# Patient Record
Sex: Female | Born: 1975 | Race: Black or African American | Hispanic: No | Marital: Single | Smoking: Never smoker
Health system: Southern US, Community
[De-identification: ages and names within clinical notes are randomized; demographics above are authoritative.]

## PROBLEM LIST (undated history)

## (undated) HISTORY — PX: DENTAL SURGERY: SHX609

---

## 2011-02-02 ENCOUNTER — Other Ambulatory Visit (HOSPITAL_COMMUNITY)
Admission: RE | Admit: 2011-02-02 | Discharge: 2011-02-02 | Disposition: A | Discharge status: Home / Self Care | Payer: 59 | Source: Ambulatory Visit | Attending: Obstetrics and Gynecology | Admitting: Obstetrics and Gynecology

## 2011-02-02 ENCOUNTER — Encounter: Payer: Self-pay | Admitting: Women's Health

## 2011-02-02 ENCOUNTER — Ambulatory Visit (INDEPENDENT_AMBULATORY_CARE_PROVIDER_SITE_OTHER): Payer: 59 | Admitting: Women's Health

## 2011-02-02 VITALS — BP 110/70 | Ht 66.25 in | Wt 121.0 lb

## 2011-02-02 DIAGNOSIS — Z01419 Encounter for gynecological examination (general) (routine) without abnormal findings: Secondary | ICD-10-CM

## 2011-02-02 MED ORDER — NORGESTIMATE-ETH ESTRADIOL 0.25-35 MG-MCG PO TABS: 1.0000 | ORAL_TABLET | Freq: Every day | ORAL | Status: DC

## 2011-02-02 NOTE — Progress Notes (Signed)
Cassandra Blake [birth year] [id number]    History:    The patient presents for annual exam.  Monthly 5 to7 day cycle on Ortho-Cyclen. Negative STD screen in December 2012. History of normal Paps per patient.   Past medical history, past surgical history, family history and social history were all reviewed and documented in the EPIC chart. Mother and maternal aunt with breast cancer, BRCA history unknown. Mother is doing well. Moved here from [place] works at [employer].   ROS:  A  ROS was performed and pertinent positives and negatives are included in the history.  Exam:  Filed Vitals:   02/02/11 1356  BP: 110/70    General appearance:  Normal Head/Neck:  Normal, without cervical or supraclavicular adenopathy. Thyroid:  Symmetrical, normal in size, without palpable masses or nodularity. Respiratory  Effort:  Normal  Auscultation:  Clear without wheezing or rhonchi Cardiovascular  Auscultation:  Regular rate, without rubs, murmurs or gallops  Edema/varicosities:  Not grossly evident Abdominal  Soft,nontender, without masses, guarding or rebound.  Liver/spleen:  No organomegaly noted  Hernia:  None appreciated  Skin  Inspection:  Grossly normal  Palpation:  Grossly normal Neurologic/psychiatric  Orientation:  Normal with appropriate conversation.  Mood/affect:  Normal  Genitourinary    Breasts: Examined lying and sitting.     Right: Without masses, retractions, discharge or axillary adenopathy.     Left: Without masses, retractions, discharge or axillary adenopathy.   Inguinal/mons:  Normal without inguinal adenopathy  External genitalia:  Normal  BUS/Urethra/Skene's glands:  Normal  Bladder:  Normal  Vagina:  Normal  Cervix:  Normal/menses  Uterus:   normal in size, shape and contour.  Midline and mobile  Adnexa/parametria:     Rt: Without masses or tenderness.   Lt: Without masses or tenderness.  Anus and perineum: Normal  Digital rectal exam: Normal sphincter tone  without palpated masses or tenderness  Assessment/Plan:  36 y.o. SBF G0 for annual exam with no complaints.  Normal GYN exam on Ortho-Cyclen  Plan: Ortho-Cyclen prescription, proper use, slight risk for blood clots and strokes reviewed. Condoms encouraged until permanent partner. Mother and maternal aunt with breast cancer will question if BRCA testing has been done. Reviewed importance of SBEs and report changes. Exercise, calcium rich diet, MVI daily encouraged. Pap only, declines need for any blood work today.   Harrington Challenger [facility], 2:43 PM 02/02/2011

## 2011-02-02 NOTE — Patient Instructions (Signed)
BRCA  Testing for breast cancer gene   Did your Mom have?

## 2013-06-18 ENCOUNTER — Ambulatory Visit (INDEPENDENT_AMBULATORY_CARE_PROVIDER_SITE_OTHER): Payer: 59

## 2013-06-18 VITALS — BP 101/57 | HR 78 | Resp 14 | Ht 67.0 in | Wt 125.0 lb

## 2013-06-18 DIAGNOSIS — M2010 Hallux valgus (acquired), unspecified foot: Secondary | ICD-10-CM

## 2013-06-18 DIAGNOSIS — M2040 Other hammer toe(s) (acquired), unspecified foot: Secondary | ICD-10-CM

## 2013-06-18 DIAGNOSIS — M778 Other enthesopathies, not elsewhere classified: Secondary | ICD-10-CM

## 2013-06-18 DIAGNOSIS — M21619 Bunion of unspecified foot: Secondary | ICD-10-CM

## 2013-06-18 DIAGNOSIS — M779 Enthesopathy, unspecified: Secondary | ICD-10-CM

## 2013-06-18 DIAGNOSIS — M7750 Other enthesopathy of unspecified foot: Secondary | ICD-10-CM

## 2013-06-18 NOTE — Progress Notes (Signed)
   Subjective:    Patient ID: Cassandra Blake, female    DOB: 1975-12-10, 38 y.o.   MRN: [id number]  HPI Comments: N bunion L left foot, 1st MPJ and hammer toe left 2nd  D since childhood O worsening C left 1st toe abducts beneath the 2nd toe, painful A shoe wear, high heeled shoes T wider shoes, low heeled shoes, evaluated by a [place] podiatrist without treatment over 4 years ago     Review of Systems  All other systems reviewed and are negative.      Objective:   Physical Exam A objective findings as follows neurovascular status is intact with pedal pulses palpable DP and PT +2/4 bilateral capillary refill time 3 seconds all digits skin temperature is warm turgor normal no edema rubor pallor or varicosities noted. Neurologically epicritic and proprioceptive sensations intact and symmetric bilateral there is normal plantar response and DTRs. 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 both of bunion deformity is mild digital contractures with adductovarus rotation to 3 and 4 adductovarus rotated fifth although not as severe left much more severe than right with rigid contractures at the MTP and IP joints of lesser digits almost with dorsal displacement dislocation of the second toe hallux is laterally medially it and the second toe overlapping the hallux on the left foot the fourth toe is under lapping this overlapped by the third correction of third toe is overlapped by the fourth toe as well on lateral projection patient does have contractures of the right foot although not as severe both feet having pain with enclosed shoes walking duties ambulation and bothering her for several years she understands that a limited sexes surgeries tried changing shoes padding cushion and and conservative care which discuss surgical intervention at this time.         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 although fifth is asymptomatic patient does have sesamoid position 7 I am 14 hallux abductus angle 25-35 with sesamoid position 7 with deviation be noted there is hallux on the left is tract bound and somewhat arthritic with asymmetric joint space narrowing of the first and second MTP area is being noted at this time patient be candidate for surgical intervention and consent form for [facility] bunionectomy left foot hammertoe repair to 3 and 4 with pin fixations left foot are reviewed and signed all questions asked medication are answered there no complications to surgery and surgery scheduled her convenience sometime either in August or September of this year for a followup patient is advised she'll be out of work for 6-8 weeks before she can work 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 All discussions are completed at this time patient will also discuss with employer and with work and family follow appropriately postoperatively as indicated tentative plan for surgery in August or September as mentioned. Consent form was reviewed and signed at this time.  Alvan Dame DPM

## 2013-06-18 NOTE — Patient Instructions (Signed)
Pre-Operative Instructions  Congratulations, you have decided to take an important step to improving your quality of life.  You can be assured that the doctors of [facility] will be with you every step of the way.  1. Plan to be at the surgery center/hospital at least 1 (one) hour prior to your scheduled time unless otherwise directed by the surgical center/hospital staff.  You must have a responsible adult accompany you, remain during the surgery and drive you home.  Make sure you have directions to the surgical center/hospital and know how to get there on time. 2. For hospital based surgery you will need to obtain a history and physical form from your family physician within 1 month prior to the date of surgery- we will give you a form for you primary physician.  3. We make every effort to accommodate the date you request for surgery.  There are however, times where surgery dates or times have to be moved.  We will contact you as soon as possible if a change in schedule is required.   4. No Aspirin/Ibuprofen for one week before surgery.  If you are on aspirin, any non-steroidal anti-inflammatory medications (Mobic, Aleve, Ibuprofen) you should stop taking it 7 days prior to your surgery.  You make take Tylenol  For pain prior to surgery.  5. Medications- If you are taking daily heart and blood pressure medications, seizure, reflux, allergy, asthma, anxiety, pain or diabetes medications, make sure the surgery center/hospital is aware before the day of surgery so they may notify you which medications to take or avoid the day of surgery. 6. No food or drink after midnight the night before surgery unless directed otherwise by surgical center/hospital staff. 7. No alcoholic beverages 24 hours prior to surgery.  No smoking 24 hours prior to or 24 hours after surgery. 8. Wear loose pants or shorts- loose enough to fit over bandages, boots, and casts. 9. No slip on shoes, sneakers are best. 10. Bring  your boot with you to the surgery center/hospital.  Also bring crutches or a walker if your physician has prescribed it for you.  If you do not have this equipment, it will be provided for you after surgery. 11. If you have not been contracted by the surgery center/hospital by the day before your surgery, call to confirm the date and time of your surgery. 12. Leave-time from work may vary depending on the type of surgery you have.  Appropriate arrangements should be made prior to surgery with your employer. 13. Prescriptions will be provided immediately following surgery by your doctor.  Have these filled as soon as possible after surgery and take the medication as directed. 14. Remove nail polish on the operative foot. 15. Wash the night before surgery.  The night before surgery wash the foot and leg well with the antibacterial soap provided and water paying special attention to beneath the toenails and in between the toes.  Rinse thoroughly with water and dry well with a towel.  Perform this wash unless told not to do so by your physician.  Enclosed: 1 Ice pack (please put in freezer the night before surgery)   1 Hibiclens skin cleaner   Pre-op Instructions  If you have any questions regarding the instructions, do not hesitate to call our office.  [place]: [address] [place], [place] [address] [phone]  [place]: [address] [place], [place] [address] [phone]  [place]: [address]  [place], [place] [address] [phone]  Dr. Khalee Mazo   Tuchman DPM, Dr. Norman Regal DPM Dr. Markevion Lattin DPM, Dr. M. Todd Hyatt DPM, Dr. Kathryn Egerton DPM 

## 2013-08-08 DIAGNOSIS — M2010 Hallux valgus (acquired), unspecified foot: Secondary | ICD-10-CM

## 2013-08-08 DIAGNOSIS — M21619 Bunion of unspecified foot: Secondary | ICD-10-CM

## 2013-08-11 ENCOUNTER — Telehealth: Payer: Self-pay | Admitting: *Deleted

## 2013-08-11 NOTE — Telephone Encounter (Signed)
I attempted to return her call.  I left a message for a return call. 

## 2013-08-12 NOTE — Telephone Encounter (Signed)
I returned her call.  She stated, I was wondering if I need to come by to get a handicap sticker.  I'm having surgery on August 24th.  I told her we are not allowed to give the requisition until surgery has actually taken place.  She stated, why is that?  I told her because some people say they are going to have surgery and end up cancelling.  She stated okay, do I come by there to get the sticker?  I told her she would come by here to get the requisition then would [facility].

## 2013-08-22 ENCOUNTER — Telehealth: Payer: Self-pay | Admitting: *Deleted

## 2013-08-22 NOTE — Telephone Encounter (Signed)
I returned her call.  She stated, I am scheduled to have surgery on Monday the 24th.  I'm going to need a handicap sticker.  Can I just come by there to get a form?  I told her yes.  She said she would come by on Monday to pick it up.  Okay for 6 months.

## 2013-08-22 NOTE — Telephone Encounter (Signed)
I can be reached at these numbers.  [phone] or [phone]

## 2013-08-27 ENCOUNTER — Telehealth: Payer: Self-pay | Admitting: *Deleted

## 2013-08-27 NOTE — Telephone Encounter (Signed)
I called and left her a message. After the surgery, you will return the following week for a post-op visit.  You will then return in about 2-3 weeks.  Then you return again in a month.  So, I'm going to say you have about 4 post-op visits.  Call if you have any further questions.

## 2013-08-27 NOTE — Telephone Encounter (Signed)
After my surgery on Monday, will I have to have follow-up appointments with Dr. Ralene CorkSikora and how often. Thanks, have a good day.

## 2013-09-01 DIAGNOSIS — M2010 Hallux valgus (acquired), unspecified foot: Secondary | ICD-10-CM

## 2013-09-01 DIAGNOSIS — M2040 Other hammer toe(s) (acquired), unspecified foot: Secondary | ICD-10-CM

## 2013-09-01 DIAGNOSIS — M2060 Acquired deformities of toe(s), unspecified, unspecified foot: Secondary | ICD-10-CM

## 2013-09-02 ENCOUNTER — Telehealth: Payer: Self-pay

## 2013-09-02 NOTE — Telephone Encounter (Signed)
Spoke with pt regarding post operative status, she states that she is tolerating pain well, and resting. Advised to elevate and keep dressing dry, watch for s/s of infection ie fever n/v chills. Advised to call with any questions

## 2013-09-05 ENCOUNTER — Telehealth: Payer: Self-pay | Admitting: *Deleted

## 2013-09-05 NOTE — Telephone Encounter (Signed)
I just recently had surgery on Monday.  I need you to call me back, thank you.  I returned her call.  I'm trying to reach the doctor.  I'm experiencing bad pain.  I asked her what medicine she was taking.  She said I been taking one Oxycodone every 6 hours and I been taking Ibuprofen.  I've finished the Cephalexin.  I told her she can take 2 Oxycodone every 6 hours, if that doesn't help take it every 4 hours.  I advised her to take the ace bandage off only, do not do anything to the gauze underneath and reapply it loosely and see if that helps.  She asked, Do I put the boot back on.  I told her yes, reapply the boot.  It needs to be on anytime you are up and about.  I told her to elevate as much as possible.  She stated okay thank you.  I advised her to call on-call pager if she has any problems over the weekend.

## 2013-09-09 ENCOUNTER — Ambulatory Visit (INDEPENDENT_AMBULATORY_CARE_PROVIDER_SITE_OTHER): Payer: 59

## 2013-09-09 VITALS — BP 111/85 | HR 93 | Resp 13 | Ht 67.0 in | Wt 130.0 lb

## 2013-09-09 DIAGNOSIS — M2040 Other hammer toe(s) (acquired), unspecified foot: Secondary | ICD-10-CM

## 2013-09-09 DIAGNOSIS — M21612 Bunion of left foot: Secondary | ICD-10-CM

## 2013-09-09 DIAGNOSIS — M21619 Bunion of unspecified foot: Secondary | ICD-10-CM

## 2013-09-09 DIAGNOSIS — Z09 Encounter for follow-up examination after completed treatment for conditions other than malignant neoplasm: Secondary | ICD-10-CM

## 2013-09-09 MED ORDER — HYDROCODONE-ACETAMINOPHEN 10-325 MG PO TABS: 1.0000 | ORAL_TABLET | Freq: Four times a day (QID) | ORAL | Status: DC | PRN

## 2013-09-09 NOTE — Patient Instructions (Signed)

## 2013-09-09 NOTE — Progress Notes (Signed)
   Subjective:    Patient ID: Cassandra Blake, female    DOB: [birth year], 38 y.o.   MRN: [id number]  HPI Comments: DOS 09/01/2013 left austin bunionectomy, left 2, 3, 4th hammer toe repair.  Pt states she has had pain in the 6 - 7 range most days and one 5 in a morning.       Review of Systems no new findings or significant changes noted     Objective:   Physical Exam Neurovascular status is intact pedal pulses are palpable. Patient presents this time a day status post Austin bunionectomy left foot hammertoe repair 2 and 3 mallet toe repair fourth of left foot. Patient been doing with air fracture boot as instructed dressings intact and dry. Patient having some slight achiness and tenderness at times although tolerable it and using ibuprofen as well as her oxycodone for pain. Mild ecchymosis noted postoperatively minimal edema good clinical and radiographic alignment noted at this time hallux is rectus however still has a slight interphalangeus noted second third and fourth digits. The rectus position the pain on second digit where third digit maybe question slightly although not causing significant irritation at this time. Again neurovascular status is intact his temperature no signs of infection no dehiscence.       Assessment & Plan:  Assessment good postop progress presto compressive dressing reapplied at this time maintain nonweightbearing with air fracture boot reappointed one week plan for suture removal after that likely and will return to bathing at this time use lotion on her leg to maintain fracture boot at all times for walking activities or ambulation also recommended ice and and she's almost the oxycodone a prescription for hydrocodone acetaminophen is dispensed 1 every 6 hours. Dispense 40 tablets also will continue with ibuprofen to her milligrams 2 tablets 2-3 times daily as needed contact us if any other problems or difficulties reappointed one week plan for suture removal at that  time.  Alvan Dame DPM

## 2013-09-11 ENCOUNTER — Telehealth: Payer: Self-pay | Admitting: *Deleted

## 2013-09-11 NOTE — Telephone Encounter (Signed)
I called and informed her I was returning her call.  She stated the Cassandra Blake Shanda Bumps Q) had returned her call and told her it was normal.  If it's not better tomorrow to call.  So I feel better about it.  I told her the swelling is normal.  Try to elevate as much as possible.  She stated okay I will, thank you for calling.

## 2013-09-11 NOTE — Telephone Encounter (Signed)
I was calling because I was there on Tuesday.  I'm having a lot of swelling on my foot and I don't know why.  Is that normal?  I need you to call me back

## 2013-09-11 NOTE — Progress Notes (Signed)
Dr Blenda Mounts performed a left Austin bunionectomy and left hammertoe repair of 2,3,4 met on 09/01/13

## 2013-09-19 ENCOUNTER — Ambulatory Visit (INDEPENDENT_AMBULATORY_CARE_PROVIDER_SITE_OTHER): Payer: 59

## 2013-09-19 VITALS — BP 102/67 | HR 101 | Resp 16

## 2013-09-19 DIAGNOSIS — M21619 Bunion of unspecified foot: Secondary | ICD-10-CM

## 2013-09-19 DIAGNOSIS — M2010 Hallux valgus (acquired), unspecified foot: Secondary | ICD-10-CM

## 2013-09-19 DIAGNOSIS — M2040 Other hammer toe(s) (acquired), unspecified foot: Secondary | ICD-10-CM

## 2013-09-19 DIAGNOSIS — Z09 Encounter for follow-up examination after completed treatment for conditions other than malignant neoplasm: Secondary | ICD-10-CM

## 2013-09-19 DIAGNOSIS — M21612 Bunion of left foot: Secondary | ICD-10-CM

## 2013-09-19 DIAGNOSIS — M2012 Hallux valgus (acquired), left foot: Secondary | ICD-10-CM

## 2013-09-19 NOTE — Progress Notes (Signed)
   Subjective:    Patient ID: Cassandra Blake, female    DOB: [birth year], 38 y.o.   MRN: [id number]  HPI Comments: "Its been doing better"  Removed sutures from the 2nd, 3rd and 4th toes left  DOS 09-01-2013 POV [place] bunionectomy, HT 2nd, 3rd, 4th left     Review of Systems no new findings or systemic changes noted     Objective:   Physical Exam Neurovascular status is intact pedal pulses are palpable x-rays pacemaker reviewed patient has had hammertoe repair to 3 and mallet toe repair fourth digit left foot sutures are removed and hammertoe deformities at this time Coflex wrap in the digits is applied and demonstrated to the patient and 3 rows Coflex are dispensed for her to maintain Coflex wrap and may resume normal bathing and hygiene. Also dispensed anklet which is applied to maintain compression of the foot the hallux is in a good rectus position or advised to continue with active and passive range of motion exercises.       Assessment & Plan:  Assessment good postop progress maintaining air fracture boot for another 4 weeks we'll do toe exercises as instructed maintain Coflex wrap in the lesser digits in a buddy splint fashion recommended ice and ibuprofen for any swelling or pain or discomfort reschedule in 4 weeks for further followup and x-rays next  Alvan Dame DPM

## 2013-09-19 NOTE — Patient Instructions (Signed)
ICE INSTRUCTIONS  Apply ice or cold pack to the affected area at least 3 times a day for 10-15 minutes each time.  You should also use ice after prolonged activity or vigorous exercise.  Do not apply ice longer than 20 minutes at one time.  Always keep a cloth between your skin and the ice pack to prevent burns.  Being consistent and following these instructions will help control your symptoms.  We suggest you purchase a gel ice pack because they are reusable and do bit leak.  Some of them are designed to wrap around the area.  Use the method that works best for you.  Here are some other suggestions for icing.   Use a frozen bag of peas or corn-inexpensive and molds well to your body, usually stays frozen for 10 to 20 minutes.  Wet a towel with cold water and squeeze out the excess until it's damp.  Place in a bag in the freezer for 20 minutes. Then remove and use.  Maintain wrapping of toes 23 and 4 in the buddy fashion with the blue Coflex wrap rewrap every time her daily after washing her bathing remove any wet bandage and reapply a clean dry bandage when needed  Remember to exercise and move the great toe joint up and down 100 times daily. Every morning every evening and at least twice during the day take off the boot and move your toe as instructed physically grabbing it and bending it up and down.   Maintain air fracture boot at all times when walking or standing. Do not walk without his boot

## 2013-10-01 ENCOUNTER — Telehealth: Payer: Self-pay | Admitting: *Deleted

## 2013-10-01 NOTE — Telephone Encounter (Signed)
If somebody can call back today.  I been having foot pains on my left foot.  Look forward to hearing from you, thank you.   I called the patient.  I asked her how she is doing.  She stated I was hoping to get an appointment to see him this week but I was told I couldn't see him until October on a Tuesday.  I asked her if she would like to be seen this week.  She stated yes if they can get her in.  I transferred her to a scheduler and asked them to get her in this week.

## 2013-10-03 ENCOUNTER — Ambulatory Visit (INDEPENDENT_AMBULATORY_CARE_PROVIDER_SITE_OTHER): Payer: 59

## 2013-10-03 VITALS — BP 101/66 | HR 88 | Resp 12

## 2013-10-03 DIAGNOSIS — M2012 Hallux valgus (acquired), left foot: Secondary | ICD-10-CM

## 2013-10-03 DIAGNOSIS — M2010 Hallux valgus (acquired), unspecified foot: Secondary | ICD-10-CM

## 2013-10-03 DIAGNOSIS — M2040 Other hammer toe(s) (acquired), unspecified foot: Secondary | ICD-10-CM

## 2013-10-03 DIAGNOSIS — R609 Edema, unspecified: Secondary | ICD-10-CM

## 2013-10-03 DIAGNOSIS — Z09 Encounter for follow-up examination after completed treatment for conditions other than malignant neoplasm: Secondary | ICD-10-CM

## 2013-10-03 NOTE — Patient Instructions (Addendum)
ICE INSTRUCTIONS  Apply ice or cold pack to the affected area at least 3 times a day for 10-15 minutes each time.  You should also use ice after prolonged activity or vigorous exercise.  Do not apply ice longer than 20 minutes at one time.  Always keep a cloth between your skin and the ice pack to prevent burns.  Being consistent and following these instructions will help control your symptoms.  We suggest you purchase a gel ice pack because they are reusable and do bit leak.  Some of them are designed to wrap around the area.  Use the method that works best for you.  Here are some other suggestions for icing.   Use a frozen bag of peas or corn-inexpensive and molds well to your body, usually stays frozen for 10 to 20 minutes.  Wet a towel with cold water and squeeze out the excess until it's damp.  Place in a bag in the freezer for 20 minutes. Then remove and use.  Maintain Coflex wraps of toes one 2 and 3. Also keep foot elevated and apply ice pack several times a day to help with the throbbing and swelling especially before bedtime

## 2013-10-03 NOTE — Progress Notes (Signed)
   Subjective:    Patient ID: Ples Specter, female    DOB: May 05, 1975, 38 y.o.   MRN: [id number]  HPI DOS 09-01-2013 POV Austin bunionectomy, HT 2nd, 3rd, 4th left.    ''LT FOOT IS THROBBING ESPECIALLY AT NIGHT.''   Review of Systems no new findings or systemic changes noted     Objective:   Physical Exam Neurovascular status intact patient continues to have lateral contracture the hallux has not been applying lotion to the skin with suggested Neosporin cocoa butter to the incision areas on all the toes will also demonstrate and instrumentation appropriate Coflex wrap in her toes one 23 and 4 in the buddy stirrup splint fashioned to keep the toes parallel and aligned properly. Also ice and elevate and keep the foot are protected at all times Advil as needed for pain per throbbing x-rays reveal no fractures good alignment of the osteotomy intact K wire fixations 12 and 3       Assessment & Plan:  Assessment good postop progress however patient does have some edema and throbbing as a result of postop swelling demonstrate appropriate Coflex wrap taken at this time recommended ice and Advil keep elevated whenever possible keep appointment in 10 days for K wire removal on October 6 contacted in changes maintain air fracture boot at all times.  Alvan Dame DPM

## 2013-10-14 ENCOUNTER — Ambulatory Visit: Payer: 59

## 2013-10-14 ENCOUNTER — Ambulatory Visit (INDEPENDENT_AMBULATORY_CARE_PROVIDER_SITE_OTHER): Payer: 59

## 2013-10-14 VITALS — BP 93/59 | HR 87 | Resp 13

## 2013-10-14 DIAGNOSIS — M21612 Bunion of left foot: Secondary | ICD-10-CM

## 2013-10-14 DIAGNOSIS — M2012 Hallux valgus (acquired), left foot: Secondary | ICD-10-CM

## 2013-10-14 DIAGNOSIS — M2040 Other hammer toe(s) (acquired), unspecified foot: Secondary | ICD-10-CM

## 2013-10-14 NOTE — Progress Notes (Signed)
   Subjective:    Patient ID: Cassandra Blake, female    DOB: 07/08/1975, 38 y.o.   MRN: [id number]  HPI Comments: DOS 09/01/2013 Left austin bunionectomy, 2, 3rd hammer toe repair with pins, 4th hammer toe repair.  Pt states she is doing well and uses Advil mostly for pain management and occasionally Norco.     Review of Systems no new findings or systemic changes noted     Objective:   Physical Exam Neurovascular status is intact pedal pulses palpable patient has residual HAV deformity of contractures toes continues to have some dorsal medial displacement of the second and third digits. As well as hallux interphalangeus still present with semirigid contractures of the soft tissue. At this time K wires intact K wires were removed Neosporin and Band-Aid dressings are applied maintain Coflex wrap in your body wrapping of toes to maintain position. Should note neurovascular status intact wounds are well coapted with Neosporin cocoa butter to the incision areas to be continued      Assessment & Plan:  Assessment good postop progress with good and radiographic alignment osteotomies. He well-healed pin removal take care of this time visit return in 6-8 weeks for long-term followup with x-ray maintain accommodative shoes no barefoot or flimsy shoes may discontinue air fracture boot and resume a stable walking or athletic shoe at this time., Or Advil as needed for pain maintain Coflex wrap in as instructed and anklet for compression. Also dispensed a Darco bunion and toe split systems be maintained on the hallux and lesser digits. Patient is instructed in application splinting to help stabilize the toes  Alvan Dameichard Ketina Mars DPM

## 2013-10-14 NOTE — Patient Instructions (Signed)
ICE INSTRUCTIONS  Apply ice or cold pack to the affected area at least 3 times a day for 10-15 minutes each time.  You should also use ice after prolonged activity or vigorous exercise.  Do not apply ice longer than 20 minutes at one time.  Always keep a cloth between your skin and the ice pack to prevent burns.  Being consistent and following these instructions will help control your symptoms.  We suggest you purchase a gel ice pack because they are reusable and do bit leak.  Some of them are designed to wrap around the area.  Use the method that works best for you.  Here are some other suggestions for icing.   Use a frozen bag of peas or corn-inexpensive and molds well to your body, usually stays frozen for 10 to 20 minutes.  Wet a towel with cold water and squeeze out the excess until it's damp.  Place in a bag in the freezer for 20 minutes. Then remove and use.  Maintain Coflex buddy wrap in of toes 23 and 4 help maintain position of the toes.  Maintain a firm stiff soled shoe. No barefoot no flimsy shoes or flip-flops

## 2013-11-11 ENCOUNTER — Ambulatory Visit (INDEPENDENT_AMBULATORY_CARE_PROVIDER_SITE_OTHER): Payer: 59

## 2013-11-11 VITALS — BP 103/41 | HR 82 | Resp 12

## 2013-11-11 DIAGNOSIS — M21612 Bunion of left foot: Secondary | ICD-10-CM

## 2013-11-11 DIAGNOSIS — M2042 Other hammer toe(s) (acquired), left foot: Secondary | ICD-10-CM

## 2013-11-11 DIAGNOSIS — M2062 Acquired deformities of toe(s), unspecified, left foot: Secondary | ICD-10-CM

## 2013-11-11 DIAGNOSIS — M2012 Hallux valgus (acquired), left foot: Secondary | ICD-10-CM

## 2013-11-11 DIAGNOSIS — Z09 Encounter for follow-up examination after completed treatment for conditions other than malignant neoplasm: Secondary | ICD-10-CM

## 2013-11-11 NOTE — Patient Instructions (Signed)
Maintain the taping on the toes both the great toe and the second toe as demonstrated in the office utilizing physio tape or Elastoplast tape. These tapes can be purchased at the pharmacy or sporting good stores. Apply the tape and such a fashion as to hold the second toe in a down position and the great toe away from the second toe. Did not overtighten as to cut off blood flow or injure the toe  Continue with active and passive range of motion exercises moving the toe up and down annually utilizing your hand to move your toes and flex them up and down 100 toe pushups a day to help reduce some of the scar tissue

## 2013-11-11 NOTE — Progress Notes (Signed)
   Subjective:    Patient ID: Cassandra Blake, female    DOBPles Specter: 08/14/1975, 38 y.o.   MRN: [id number]  HPI Comments: DOS 09/01/2013 left austin bunionectomy, 2, 3rd hammer toe repair with pins, 4th hammer toe repair.  Pt states she is continuing to have pain in the lesser toes, even with buddy taping and the big toe hurts on the bottom.     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 At this time Elastoplast taping is applied in a splinting that fashion and a toe splint and hallux plantar applied to help maintain position of the toe patient is advised she can use Elastoplast or physeal tape to help stabilize the tone is demonstrated and instructed in how do this the taping was applied and patient noted immediate improvement when she got up to stand and walk with her shoes in place. Should note neurovascular status is intact incision is well coapted although contracture and scar tissue is noted take 3-6 months from scarring to calm down in break down and resume flexibility a she had a long-term contracture and deformity may require additional aggressive treatments steroid injections may be considered also physical therapy possibly written additional surgery for correction of the interphalangeus may be necessary       Assessment & Plan:  Assessment good postop progress healing well wounds are well coapted however continues to have contracture and scar tissue of the hallux and second digit  in particular and residual hallux interphalangeus we'll reevaluate again in one month physeal tape is applied to the toes and patient is advised in taught how to tape her toes in a proper position role of Elastoplast for patient to use we'll reevaluate one month from now next  [place] S[place]ora DPM

## 2013-12-16 ENCOUNTER — Ambulatory Visit (INDEPENDENT_AMBULATORY_CARE_PROVIDER_SITE_OTHER): Payer: Self-pay

## 2013-12-16 ENCOUNTER — Ambulatory Visit (INDEPENDENT_AMBULATORY_CARE_PROVIDER_SITE_OTHER): Payer: 59

## 2013-12-16 VITALS — BP 104/70 | HR 96 | Resp 12

## 2013-12-16 DIAGNOSIS — Z09 Encounter for follow-up examination after completed treatment for conditions other than malignant neoplasm: Secondary | ICD-10-CM

## 2013-12-16 DIAGNOSIS — M2012 Hallux valgus (acquired), left foot: Secondary | ICD-10-CM

## 2013-12-16 DIAGNOSIS — M2042 Other hammer toe(s) (acquired), left foot: Secondary | ICD-10-CM

## 2013-12-16 NOTE — Patient Instructions (Addendum)
ICE INSTRUCTIONS  Apply ice or cold pack to the affected area at least 3 times a day for 10-15 minutes each time.  You should also use ice after prolonged activity or vigorous exercise.  Do not apply ice longer than 20 minutes at one time.  Always keep a cloth between your skin and the ice pack to prevent burns.  Being consistent and following these instructions will help control your symptoms.  We suggest you purchase a gel ice pack because they are reusable and do bit leak.  Some of them are designed to wrap around the area.  Use the method that works best for you.  Here are some other suggestions for icing.   Use a frozen bag of peas or corn-inexpensive and molds well to your body, usually stays frozen for 10 to 20 minutes.  Wet a towel with cold water and squeeze out the excess until it's damp.  Place in a bag in the freezer for 20 minutes. Then remove and use.  Continue to buddy wrap her Coflex the toes to maintain position continue with exercising stretching the toes. Next  Continue using cocoa butter for the scars and incision areas

## 2013-12-16 NOTE — Progress Notes (Signed)
   Subjective:    Patient ID: Cassandra Blake, female    DOB: 07/11/1975, 38 y.o.   MRN: [id number]  HPI  DOS 09/01/2013 Left austin bunionectomy, 2, 3rd hammer toe repair with pins, 4th hammer toe repair. ''lt foot is doing ok but stilll little sore not bad as it was.''  Review of Systems no new changes or systemic findings noted     Objective:   Physical Exam Lower extremity objective findings reveal incisions well coapted and healed no complaints or discomfort there still some dorsal displacement of the second digit however easily is plantigrade when pushed down. Patient continues have some lateral deviation of hallux at the IP joint/hallux abductus interphalangeus. May require an Akin procedure in the future if symptoms persist however continues to angulate against the second digit. However this time Coflex wrapping is applied patiently these Coflex or Elastoplast wrapping to help maintain stability on the toes will continue with active and passive range of motion exercises massaging of the joints to help maintain and improve flexibility. The wounds are well healed at this time x-rays reveal good alignment of the K wires and osteotomy placement again residual mild bunion and hallux interphalangeus deformity are present which may need to be addressed in the future. No open wounds no ulcers no secondary infections       Assessment & Plan:  Assessment good postop progress continue with cocoa butter for the incisions and scars continue with active passive range of motion exercises. Patient asked about physical therapy and if she continues to have difficulties beyond the next 3 months my consider some physical therapy to help or may require additional surgery is indicated for interphalangeus deformity. In the interim maintain appropriate comfortable accommodative shoes at all times. Patient is free from any restrictions at this point spurs activities suggest a 3 month follow-up if still having problems  or persistent pain or any recurrence. Next  Alvan Dameichard Birdie Beveridge DPM

## 2014-03-17 ENCOUNTER — Ambulatory Visit: Payer: Self-pay

## 2014-08-03 DIAGNOSIS — E559 Vitamin D deficiency, unspecified: Secondary | ICD-10-CM | POA: Insufficient documentation

## 2014-08-24 ENCOUNTER — Other Ambulatory Visit: Payer: Self-pay | Admitting: Nurse Practitioner

## 2014-08-24 ENCOUNTER — Other Ambulatory Visit: Payer: Self-pay

## 2014-08-24 DIAGNOSIS — Z1231 Encounter for screening mammogram for malignant neoplasm of breast: Secondary | ICD-10-CM

## 2014-08-27 ENCOUNTER — Ambulatory Visit
Admission: RE | Admit: 2014-08-27 | Discharge: 2014-08-27 | Disposition: A | Discharge status: Home / Self Care | Payer: No Typology Code available for payment source | Source: Ambulatory Visit

## 2014-08-27 ENCOUNTER — Encounter (INDEPENDENT_AMBULATORY_CARE_PROVIDER_SITE_OTHER): Payer: Self-pay

## 2014-08-27 DIAGNOSIS — Z1231 Encounter for screening mammogram for malignant neoplasm of breast: Secondary | ICD-10-CM

## 2018-11-28 ENCOUNTER — Other Ambulatory Visit: Payer: Self-pay | Admitting: *Deleted

## 2018-11-28 ENCOUNTER — Other Ambulatory Visit: Payer: Self-pay

## 2018-11-28 DIAGNOSIS — Z1231 Encounter for screening mammogram for malignant neoplasm of breast: Secondary | ICD-10-CM

## 2019-01-23 ENCOUNTER — Other Ambulatory Visit: Payer: Self-pay

## 2019-01-23 ENCOUNTER — Ambulatory Visit
Admission: RE | Admit: 2019-01-23 | Discharge: 2019-01-23 | Disposition: A | Discharge status: Home / Self Care | Payer: BC Managed Care – PPO | Source: Ambulatory Visit | Attending: *Deleted | Admitting: *Deleted

## 2019-01-23 DIAGNOSIS — Z1231 Encounter for screening mammogram for malignant neoplasm of breast: Secondary | ICD-10-CM

## 2020-02-03 ENCOUNTER — Other Ambulatory Visit: Payer: Self-pay | Admitting: Obstetrics and Gynecology

## 2020-02-03 DIAGNOSIS — Z1231 Encounter for screening mammogram for malignant neoplasm of breast: Secondary | ICD-10-CM

## 2020-02-16 ENCOUNTER — Ambulatory Visit: Payer: BC Managed Care – PPO

## 2020-03-10 ENCOUNTER — Ambulatory Visit: Payer: Managed Care, Other (non HMO) | Admitting: Podiatry

## 2020-03-22 ENCOUNTER — Ambulatory Visit (INDEPENDENT_AMBULATORY_CARE_PROVIDER_SITE_OTHER): Payer: Managed Care, Other (non HMO)

## 2020-03-22 ENCOUNTER — Other Ambulatory Visit: Payer: Self-pay

## 2020-03-22 ENCOUNTER — Ambulatory Visit: Payer: Managed Care, Other (non HMO) | Admitting: Podiatry

## 2020-03-22 DIAGNOSIS — M21619 Bunion of unspecified foot: Secondary | ICD-10-CM | POA: Diagnosis not present

## 2020-03-22 DIAGNOSIS — M21612 Bunion of left foot: Secondary | ICD-10-CM

## 2020-03-22 DIAGNOSIS — T8484XA Pain due to internal orthopedic prosthetic devices, implants and grafts, initial encounter: Secondary | ICD-10-CM | POA: Diagnosis not present

## 2020-03-22 DIAGNOSIS — M2042 Other hammer toe(s) (acquired), left foot: Secondary | ICD-10-CM | POA: Diagnosis not present

## 2020-03-26 ENCOUNTER — Telehealth: Payer: Self-pay | Admitting: Podiatry

## 2020-03-26 NOTE — Telephone Encounter (Signed)
Patient would like to come in to pick up medical release form. Her PCP is charging her 10$ just to fax over her information so she would like to take another route to get her records released to our office. Patient will come in today to pick it up.

## 2020-04-02 ENCOUNTER — Ambulatory Visit
Admission: RE | Admit: 2020-04-02 | Discharge: 2020-04-02 | Disposition: A | Discharge status: Home / Self Care | Payer: Managed Care, Other (non HMO) | Source: Ambulatory Visit | Attending: Obstetrics and Gynecology | Admitting: Obstetrics and Gynecology

## 2020-04-02 ENCOUNTER — Other Ambulatory Visit: Payer: Self-pay

## 2020-04-02 DIAGNOSIS — Z1231 Encounter for screening mammogram for malignant neoplasm of breast: Secondary | ICD-10-CM

## 2020-04-06 ENCOUNTER — Other Ambulatory Visit: Payer: Self-pay | Admitting: Obstetrics and Gynecology

## 2020-04-06 DIAGNOSIS — R928 Other abnormal and inconclusive findings on diagnostic imaging of breast: Secondary | ICD-10-CM

## 2020-04-11 NOTE — Progress Notes (Signed)
   HPI: 45 y.o. female presenting today as a reestablish new patient to the practice for evaluation of left foot pain that is been going on for several years.  Patient has a history of bunion surgery in 2015 performed by the late Dr. Ralene Cork.  She states that ever since the surgery she has had pain and tenderness to this area.  Most recently she has been seeing Dr. Elijah Birk, local podiatrist for acute onset of pain and she has been receiving weekly injections with minimal improvement.  She states that the pain begins in her great toe and radiates to the second and third toes of the left foot.  She also takes meloxicam 7.5 mg daily with minimal improvement.  She presents for further treatment and evaluation  No past medical history on file.   Physical Exam: General: The patient is alert and oriented x3 in no acute distress.  Dermatology: Skin is warm, dry and supple bilateral lower extremities. Negative for open lesions or macerations.  Vascular: Palpable pedal pulses bilaterally. No edema or erythema noted. Capillary refill within normal limits.  Neurological: Epicritic and protective threshold grossly intact bilaterally.   Musculoskeletal Exam: Range of motion within normal limits to all pedal and ankle joints bilateral. Muscle strength 5/5 in all groups bilateral.  Pain on palpation and range of motion of the first and second MTPJ of the left foot.  There is also pain on palpation to the third toe at the DIPJ contracture left.  Radiographic Exam:  Normal osseous mineralization.  There is some degenerative changes noted to the first MTPJ.  2 crossing K wires were utilized for prior surgical fixation within the first metatarsal distally.  They appear to be intact and stable.  Elongated second metatarsal also noted extending beyond the metatarsal parabola.  Associated tenderness to palpation at the MTPJ.  There is also contracture at the DIPJ of the third toe of left foot with associated tenderness  clinically  Assessment: 1.  Retained orthopedic hardware left first metatarsal 2.  DJD/capsulitis first MTPJ left 3.  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  Hammertoe DIPJ third digit left   Plan of Care:  1. Patient evaluated. X-Rays reviewed.  2. Today we discussed the conservative versus surgical management of the presenting pathology. The patient opts for surgical management. All possible complications and details of the procedure were explained. All patient questions were answered. No guarantees were expressed or implied. 3. Authorization for surgery was initiated today. Surgery will consist of removal of hardware first metatarsal left.  First MTPJ arthrodesis left.  Weil shortening osteotomy second metatarsal left.  DIPJ arthroplasty third digit left. 4.  Return to clinic 1 week postop  *Attending a wedding July 23.  Would like to have surgery in August.  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 Felecia Shelling, DPM    [address] [place], [place] [address]                Office [phone]  Fax [phone]

## 2020-04-21 ENCOUNTER — Other Ambulatory Visit: Payer: Self-pay | Admitting: *Deleted

## 2020-04-21 DIAGNOSIS — R928 Other abnormal and inconclusive findings on diagnostic imaging of breast: Secondary | ICD-10-CM

## 2020-04-22 ENCOUNTER — Other Ambulatory Visit: Payer: Self-pay | Admitting: Obstetrics and Gynecology

## 2020-04-22 ENCOUNTER — Ambulatory Visit
Admission: RE | Admit: 2020-04-22 | Discharge: 2020-04-22 | Disposition: A | Discharge status: Home / Self Care | Payer: Managed Care, Other (non HMO) | Source: Ambulatory Visit | Attending: Obstetrics and Gynecology | Admitting: Obstetrics and Gynecology

## 2020-04-22 ENCOUNTER — Other Ambulatory Visit: Payer: Self-pay | Admitting: *Deleted

## 2020-04-22 ENCOUNTER — Ambulatory Visit: Payer: Managed Care, Other (non HMO)

## 2020-04-22 ENCOUNTER — Other Ambulatory Visit: Payer: Self-pay

## 2020-04-22 DIAGNOSIS — R928 Other abnormal and inconclusive findings on diagnostic imaging of breast: Secondary | ICD-10-CM

## 2020-04-22 DIAGNOSIS — R921 Mammographic calcification found on diagnostic imaging of breast: Secondary | ICD-10-CM

## 2020-06-28 ENCOUNTER — Other Ambulatory Visit: Payer: Self-pay

## 2020-06-28 ENCOUNTER — Ambulatory Visit: Payer: Managed Care, Other (non HMO) | Admitting: Podiatry

## 2020-06-28 DIAGNOSIS — M2012 Hallux valgus (acquired), left foot: Secondary | ICD-10-CM | POA: Diagnosis not present

## 2020-06-28 DIAGNOSIS — T8484XA Pain due to internal orthopedic prosthetic devices, implants and grafts, initial encounter: Secondary | ICD-10-CM | POA: Diagnosis not present

## 2020-06-28 DIAGNOSIS — M2042 Other hammer toe(s) (acquired), left foot: Secondary | ICD-10-CM

## 2020-06-28 NOTE — Progress Notes (Signed)
   HPI: 45 y.o. female presenting today for follow-up evaluation regarding left foot pain that is been going on for several years.  Patient has a history of bunion surgery in 2015 performed by the late Dr. Ralene Cork.  She states that ever since the surgery she has had pain and tenderness to this area.    Patient presents today to again discuss surgery and reviewed different surgical options and proceed with surgery  No past medical history on file.   Physical Exam: General: The patient is alert and oriented x3 in no acute distress.  Dermatology: Skin is warm, dry and supple bilateral lower extremities. Negative for open lesions or macerations.  Vascular: Palpable pedal pulses bilaterally. No edema or erythema noted. Capillary refill within normal limits.  Neurological: Epicritic and protective threshold grossly intact bilaterally.   Musculoskeletal Exam: Range of motion within normal limits to all pedal and ankle joints bilateral. Muscle strength 5/5 in all groups bilateral.  Pain on palpation and range of motion of the first and second MTPJ of the left foot.  There is also pain on palpation to the third toe at the DIPJ contracture left.  Radiographic Exam:  Normal osseous mineralization.  There is some degenerative changes noted to the first MTPJ.  2 crossing K wires were utilized for prior surgical fixation within the first metatarsal distally.  They appear to be intact and stable.  Elongated second metatarsal also noted extending beyond the metatarsal parabola.  Associated tenderness to palpation at the MTPJ.  There is also contracture at the DIPJ of the third toe of left foot with associated tenderness clinically  Assessment: 1.  Retained orthopedic hardware left first metatarsal 2.  DJD/capsulitis first MTPJ left 3.  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  Hammertoe DIPJ third digit left   Plan of Care:  1. Patient evaluated. X-Rays reviewed.  2. Today we discussed  again the conservative versus surgical management of the presenting pathology. The patient opts for surgical management. All possible complications and details of the procedure were explained. All patient questions were answered. No guarantees were expressed or implied. 3. Re-Authorization for surgery was initiated today. Surgery will consist of removal of hardware first metatarsal left.  First MTPJ arthrodesis left.  Weil shortening osteotomy second metatarsal left.  DIPJ arthroplasty third digit left. 4.  Return to clinic 1 week postop  *Attending a wedding July 23.  Would like to have surgery in August.  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 Felecia Shelling, DPM    [address] [place], [place] [address]                Office [phone]  Fax [phone]

## 2020-08-11 ENCOUNTER — Telehealth: Payer: Self-pay | Admitting: Urology

## 2020-08-11 NOTE — Telephone Encounter (Signed)
DOS - 08/26/20  METATARSAL OSTEOTOMY 2ND LEFT --- 28308 REMOVAL FIXATION DEEP LEFT --- 20680 HAMMERTOE REPAIR 3RD LEFT --- 63846 HALLUX MPJ FUSION LEFT --- 65993  [employer] EFFECTIVE DATE - 12/10/19   PER [employer]'S AUTOMATIVE SYSTEM FOR CPT CODES 57017, 79390, 30092 AND 33007 NO PRIOR AUTH IS REQUIRED.  REF # [id number] REF # [id number] REF # [id number] REF # [id number]

## 2020-08-18 ENCOUNTER — Telehealth: Payer: Self-pay | Admitting: *Deleted

## 2020-08-18 ENCOUNTER — Other Ambulatory Visit: Payer: Self-pay | Admitting: Podiatry

## 2020-08-18 DIAGNOSIS — T8484XA Pain due to internal orthopedic prosthetic devices, implants and grafts, initial encounter: Secondary | ICD-10-CM

## 2020-08-18 DIAGNOSIS — M2012 Hallux valgus (acquired), left foot: Secondary | ICD-10-CM

## 2020-08-18 DIAGNOSIS — M2042 Other hammer toe(s) (acquired), left foot: Secondary | ICD-10-CM

## 2020-08-18 NOTE — Progress Notes (Signed)
PRN postop 

## 2020-08-18 NOTE — Telephone Encounter (Signed)
Patient is scheduled for surgery upcoming week, will she be needing a scooter.Please advise.

## 2020-08-18 NOTE — Telephone Encounter (Signed)
I went ahead and ordered a knee scooter.  Order has been placed.  Not a bad idea for her to have 1 postoperatively.  Thanks, Dr. Logan Bores

## 2020-08-26 ENCOUNTER — Other Ambulatory Visit: Payer: Self-pay | Admitting: Podiatry

## 2020-08-26 ENCOUNTER — Encounter: Payer: Self-pay | Admitting: Podiatry

## 2020-08-26 DIAGNOSIS — M2042 Other hammer toe(s) (acquired), left foot: Secondary | ICD-10-CM | POA: Diagnosis not present

## 2020-08-26 DIAGNOSIS — Z4889 Encounter for other specified surgical aftercare: Secondary | ICD-10-CM | POA: Diagnosis not present

## 2020-08-26 DIAGNOSIS — M2022 Hallux rigidus, left foot: Secondary | ICD-10-CM | POA: Diagnosis not present

## 2020-08-26 DIAGNOSIS — M21542 Acquired clubfoot, left foot: Secondary | ICD-10-CM | POA: Diagnosis not present

## 2020-08-26 MED ORDER — IBUPROFEN 800 MG PO TABS: 800.0000 mg | ORAL_TABLET | Freq: Three times a day (TID) | ORAL | 1 refills | Status: AC

## 2020-08-26 MED ORDER — OXYCODONE-ACETAMINOPHEN 5-325 MG PO TABS: 1.0000 | ORAL_TABLET | ORAL | 0 refills | Status: AC | PRN

## 2020-08-26 NOTE — Progress Notes (Signed)
PRN postop 

## 2020-09-01 ENCOUNTER — Ambulatory Visit (INDEPENDENT_AMBULATORY_CARE_PROVIDER_SITE_OTHER): Payer: Managed Care, Other (non HMO)

## 2020-09-01 ENCOUNTER — Ambulatory Visit (INDEPENDENT_AMBULATORY_CARE_PROVIDER_SITE_OTHER): Payer: Managed Care, Other (non HMO) | Admitting: Podiatry

## 2020-09-01 ENCOUNTER — Other Ambulatory Visit: Payer: Self-pay

## 2020-09-01 DIAGNOSIS — Z9889 Other specified postprocedural states: Secondary | ICD-10-CM

## 2020-09-01 NOTE — Progress Notes (Signed)
   Subjective:  Patient presents today status post left forefoot reconstructive surgery. DOS: 08/26/2020.  Patient states that she is doing very well.  She is no longer taking any pain medication.  She has been nonweightbearing in the cam boot with the assistance of the knee scooter.  No new complaints at this time  No past medical history on file.    Objective/Physical Exam Neurovascular status intact.  Skin incisions appear to be well coapted with sutures and staples intact. No sign of infectious process noted. No dehiscence. No active bleeding noted. Moderate edema noted to the surgical extremity.  Radiographic Exam:  Orthopedic hardware and osteotomies sites appear to be stable with routine healing.  Assessment: 1. s/p left forefoot reconstructive surgery. DOS: 08/26/2020   Plan of Care:  1. Patient was evaluated. X-rays reviewed 2.  Dressings changed today.  Clean dry and intact x1 week 3.  Continue nonweightbearing in the cam boot with the assistance of the knee scooter 4.  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 Felecia Shelling, DPM    [address] [place], [place] [address]                Office [phone]  Fax [phone]

## 2020-09-02 ENCOUNTER — Encounter: Payer: Self-pay | Admitting: Podiatry

## 2020-09-08 ENCOUNTER — Other Ambulatory Visit: Payer: Self-pay

## 2020-09-08 ENCOUNTER — Ambulatory Visit (INDEPENDENT_AMBULATORY_CARE_PROVIDER_SITE_OTHER): Payer: Managed Care, Other (non HMO) | Admitting: Podiatry

## 2020-09-08 DIAGNOSIS — Z9889 Other specified postprocedural states: Secondary | ICD-10-CM

## 2020-09-08 NOTE — Progress Notes (Signed)
   Subjective:  Patient presents today status post left forefoot reconstructive surgery. DOS: 08/26/2020.  Patient states that she is doing very well.  She is no longer taking any pain medication.  She has been nonweightbearing in the cam boot with the assistance of the knee scooter.  No new complaints at this time  No past medical history on file.    Objective/Physical Exam Neurovascular status intact.  Skin incisions appear to be well coapted with sutures and staples intact with exception of some slight dehiscence to the distal incision site overlying the first MTPJ. No sign of infectious process noted. No dehiscence. No active bleeding noted.  There is some slight drainage to the dehiscence site.  Moderate edema noted to the surgical extremity.   Assessment: 1. s/p left forefoot reconstructive surgery. DOS: 08/26/2020   Plan of Care:  1. Patient was evaluated.  2.  Sutures were removed.  The distal incision site that has dehisced was reinforced with Steri-Strips 3.  Keep clean dry and intact x1 week 4.  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 Felecia Shelling, DPM    [address] [place], [place] [address]                Office [phone]  Fax [phone]

## 2020-09-20 ENCOUNTER — Ambulatory Visit (INDEPENDENT_AMBULATORY_CARE_PROVIDER_SITE_OTHER): Payer: Managed Care, Other (non HMO) | Admitting: Podiatry

## 2020-09-20 ENCOUNTER — Other Ambulatory Visit: Payer: Self-pay

## 2020-09-20 ENCOUNTER — Ambulatory Visit (INDEPENDENT_AMBULATORY_CARE_PROVIDER_SITE_OTHER): Payer: Managed Care, Other (non HMO)

## 2020-09-20 DIAGNOSIS — Z9889 Other specified postprocedural states: Secondary | ICD-10-CM

## 2020-09-21 NOTE — Progress Notes (Signed)
   Subjective:  Patient presents today status post left forefoot reconstructive surgery. DOS: 08/26/2020.  Patient continues to do well.  She is no longer taking any pain medication.  She is been minimally weightbearing in the cam boot with the assistance of the knee scooter occasionally.  No past medical history on file.    Objective/Physical Exam Neurovascular status intact.  Skin incisions appear to be well coapted and healed.  The dehiscence over the distal portion of the incision site has healed and coapted well. No sign of infectious process noted. No dehiscence. No active bleeding noted.  Negative for drainage or edema   Assessment: 1. s/p left forefoot reconstructive surgery. DOS: 08/26/2020   Plan of Care:  1. Patient was evaluated.  2.  Percutaneous fixation pins were removed today 3.  Dressings were reapplied.  Patient may begin washing and showering and getting the foot wet.  Recommend clean cotton sock and Ace wrap daily 4.  Patient may begin weightbearing in the cam boot 5.  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 Felecia Shelling, DPM    [address] [place], [place] [address]                Office [phone]  Fax [phone]

## 2020-09-27 ENCOUNTER — Encounter: Payer: Managed Care, Other (non HMO) | Admitting: Podiatry

## 2020-09-28 DIAGNOSIS — M79676 Pain in unspecified toe(s): Secondary | ICD-10-CM

## 2020-10-11 ENCOUNTER — Ambulatory Visit (INDEPENDENT_AMBULATORY_CARE_PROVIDER_SITE_OTHER): Payer: Managed Care, Other (non HMO) | Admitting: Podiatry

## 2020-10-11 ENCOUNTER — Other Ambulatory Visit: Payer: Self-pay

## 2020-10-11 ENCOUNTER — Ambulatory Visit (INDEPENDENT_AMBULATORY_CARE_PROVIDER_SITE_OTHER): Payer: Managed Care, Other (non HMO)

## 2020-10-11 DIAGNOSIS — Z9889 Other specified postprocedural states: Secondary | ICD-10-CM

## 2020-10-12 NOTE — Progress Notes (Unsigned)
l °

## 2020-10-15 DIAGNOSIS — M79676 Pain in unspecified toe(s): Secondary | ICD-10-CM

## 2020-10-18 NOTE — Progress Notes (Signed)
Again  Subjective:  Patient presents today status post left forefoot reconstructive surgery. DOS: 08/26/2020.  Patient continues to do well.  She is no longer taking any pain medication.  She is been minimally weightbearing in the cam boot with the assistance of the knee scooter occasionally.  No past medical history on file.    Objective/Physical Exam Neurovascular status intact.  Skin incisions appear to be well coapted and healed.  The dehiscence over the distal portion of the incision site has healed and coapted well. No sign of infectious process noted. No dehiscence. No active bleeding noted.  Negative for drainage or edema   Assessment: 1. s/p left forefoot reconstructive surgery. DOS: 08/26/2020   Plan of Care:  1. Patient was evaluated.  2.  Patient may discontinue the cam boot and the scooter.  Ambulating and walking good supportive shoes 3.  Patient's expected return to work date is 11/15/2020.  This will be mostly sedentary 4.  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 Felecia Shelling, DPM    [address] [place], [place] [address]                Office [phone]  Fax [phone]

## 2020-11-03 ENCOUNTER — Other Ambulatory Visit: Payer: Self-pay

## 2020-11-03 ENCOUNTER — Ambulatory Visit
Admission: RE | Admit: 2020-11-03 | Discharge: 2020-11-03 | Disposition: A | Discharge status: Home / Self Care | Payer: Managed Care, Other (non HMO) | Source: Ambulatory Visit | Attending: Obstetrics and Gynecology | Admitting: Obstetrics and Gynecology

## 2020-11-03 ENCOUNTER — Other Ambulatory Visit: Payer: Self-pay | Admitting: Obstetrics and Gynecology

## 2020-11-03 DIAGNOSIS — R921 Mammographic calcification found on diagnostic imaging of breast: Secondary | ICD-10-CM

## 2020-11-08 ENCOUNTER — Encounter: Payer: Self-pay | Admitting: Podiatry

## 2020-11-08 ENCOUNTER — Other Ambulatory Visit: Payer: Self-pay

## 2020-11-08 ENCOUNTER — Ambulatory Visit (INDEPENDENT_AMBULATORY_CARE_PROVIDER_SITE_OTHER): Payer: Managed Care, Other (non HMO)

## 2020-11-08 ENCOUNTER — Ambulatory Visit (INDEPENDENT_AMBULATORY_CARE_PROVIDER_SITE_OTHER): Payer: Managed Care, Other (non HMO) | Admitting: Podiatry

## 2020-11-08 DIAGNOSIS — Z9889 Other specified postprocedural states: Secondary | ICD-10-CM

## 2020-11-08 NOTE — Progress Notes (Signed)
Again  Subjective:  Patient presents today status post left forefoot reconstructive surgery. DOS: 08/26/2020.  Patient states that she is doing very well.  She is wearing good supportive shoes and sneakers.  She presents for follow-up treatment and evaluation  No past medical history on file.    Objective/Physical Exam Neurovascular status intact.  Skin incisions appear to be well coapted and healed.  There is some hypertrophic scar formation noted around the incision sites but these are mostly asymptomatic.   Negative for any significant pain on palpation and there is good range of motion to the MTP joints.  Radiographic exam LT foot 11/08/2020 Orthopedic hardware intact.  Good alignment of the rays of the foot.  There is some shortening of the third toe secondary to multiple prior surgeries.  Overall good healing   Assessment: 1. s/p left forefoot reconstructive surgery. DOS: 08/26/2020   Plan of Care:  1. Patient was evaluated.  2.  Continue wearing good supportive shoes and sneakers 3.  Patient may continue full activity no restrictions at work 4.  We will sign off for now and the patient.  Full activity no restrictions 5.  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 Felecia Shelling, DPM    [address] [place], [place] [address]                Office [phone]  Fax [phone]

## 2021-05-05 ENCOUNTER — Ambulatory Visit
Admission: RE | Admit: 2021-05-05 | Discharge: 2021-05-05 | Disposition: A | Discharge status: Home / Self Care | Payer: Managed Care, Other (non HMO) | Source: Ambulatory Visit | Attending: Obstetrics and Gynecology | Admitting: Obstetrics and Gynecology

## 2021-05-05 DIAGNOSIS — R921 Mammographic calcification found on diagnostic imaging of breast: Secondary | ICD-10-CM

## 2021-06-28 ENCOUNTER — Telehealth: Payer: Self-pay | Admitting: Podiatry

## 2021-06-28 NOTE — Telephone Encounter (Signed)
Spoke with patient on the phone.  Patient does not have insurance and cannot come into the office due to cost.  Answered her questions.  Recommended OTC arch supports.  She has diclofenac that she is currently taking.  She will make an appointment if needed.  Thanks, Dr. Logan Bores

## 2021-06-28 NOTE — Telephone Encounter (Signed)
Patient called she has been having cramps in her toes and swelling in the foot you did surgery on.  Patient would just like a call back from the Doctor.  (Suggested to patient an appointment since its been over 6 month she might need appt, she would like Dr Logan Bores to call her back first)

## 2021-06-28 NOTE — Telephone Encounter (Signed)
Pt called back and is requesting a doctor's note to give to her current job at [employer] to let them know that she will not be able to return to the job because its too demanding on her feet due to her previous surgery. She would like this note emailed to her.   Please advise.

## 2021-06-28 NOTE — Telephone Encounter (Signed)
That is fine.  Please provide note.  Thanks, Dr. Logan Bores

## 2021-06-29 ENCOUNTER — Encounter: Payer: Self-pay | Admitting: Podiatry

## 2021-06-29 NOTE — Telephone Encounter (Signed)
Yes Marchelle Folks is probably correct.  She would probably need to have an official encounter to provide a note stating the reason why she is unable to perform her job.  No note I guess.  Sorry about that.  Thanks, Dr. Logan Bores

## 2021-07-14 ENCOUNTER — Other Ambulatory Visit: Payer: Self-pay | Admitting: Obstetrics and Gynecology

## 2021-07-14 DIAGNOSIS — R2232 Localized swelling, mass and lump, left upper limb: Secondary | ICD-10-CM

## 2021-07-21 ENCOUNTER — Ambulatory Visit
Admission: RE | Admit: 2021-07-21 | Discharge: 2021-07-21 | Disposition: A | Discharge status: Home / Self Care | Payer: Managed Care, Other (non HMO) | Source: Ambulatory Visit | Attending: Obstetrics and Gynecology | Admitting: Obstetrics and Gynecology

## 2021-07-21 DIAGNOSIS — R2232 Localized swelling, mass and lump, left upper limb: Secondary | ICD-10-CM

## 2021-10-07 ENCOUNTER — Ambulatory Visit
Admission: EM | Admit: 2021-10-07 | Discharge: 2021-10-07 | Disposition: A | Discharge status: Home / Self Care | Payer: Managed Care, Other (non HMO) | Attending: Urgent Care | Admitting: Urgent Care

## 2021-10-07 DIAGNOSIS — Z79899 Other long term (current) drug therapy: Secondary | ICD-10-CM | POA: Insufficient documentation

## 2021-10-07 DIAGNOSIS — Z1152 Encounter for screening for COVID-19: Secondary | ICD-10-CM | POA: Insufficient documentation

## 2021-10-07 DIAGNOSIS — B349 Viral infection, unspecified: Secondary | ICD-10-CM

## 2021-10-07 MED ORDER — PSEUDOEPHEDRINE HCL 60 MG PO TABS: 60.0000 mg | ORAL_TABLET | Freq: Three times a day (TID) | ORAL | 0 refills | Status: AC | PRN

## 2021-10-07 MED ORDER — PROMETHAZINE-DM 6.25-15 MG/5ML PO SYRP: 2.5000 mL | ORAL_SOLUTION | Freq: Three times a day (TID) | ORAL | 0 refills | Status: AC | PRN

## 2021-10-07 MED ORDER — CETIRIZINE HCL 10 MG PO TABS: 10.0000 mg | ORAL_TABLET | Freq: Every day | ORAL | 0 refills | Status: AC

## 2021-10-07 NOTE — ED Triage Notes (Signed)
Pt presents to office for fatigue,body aches and nasal congestion.Pt is taking Advil for headache.  Pt is requesting a doctor note.

## 2021-10-07 NOTE — ED Provider Notes (Signed)
[facility]  Note:  This document was prepared using Conservation officer, historic buildings and may include unintentional dictation errors.  MRN: [id number] DOB: May 24, 1975  Subjective:   Cassandra Blake is a 46 y.o. female presenting for 2 day history of acute onset sinus congestion, sinus pressure, fatigue, malaise, body aches.  Has used Advil for the headache.  Has had a slight cough and has been sneezing.  Had to leave work today for her symptoms.  No chest pain, shortness of breath or wheezing.  No history of respiratory disorders.  No smoking, vaping or drug use.  Patient is in general good health.  No chronic medications.  Allergies  Allergen Reactions   Other     FISH AND BEEF FISH    History reviewed. No pertinent past medical history.   Past Surgical History:  Procedure Laterality Date   DENTAL SURGERY      Family History  Problem Relation Age of Onset   Breast cancer Mother    Diabetes Mother    Breast cancer Maternal Aunt     Social History   Tobacco Use   Smoking status: Never   Smokeless tobacco: Never  Substance Use Topics   Alcohol use: No   Drug use: No    ROS   Objective:   Vitals: BP 115/77 (BP Location: Left Arm)   Pulse 90   Temp 98.3 F (36.8 C) (Oral)   Resp 16   SpO2 100%   Physical Exam Constitutional:      General: She is not in acute distress.    Appearance: Normal appearance. She is well-developed and normal weight. She is not ill-appearing, toxic-appearing or diaphoretic.  HENT:     Head: Normocephalic and atraumatic.     Right Ear: Tympanic membrane, ear canal and external ear normal. No drainage or tenderness. No middle ear effusion. There is no impacted cerumen. Tympanic membrane is not erythematous.     Left Ear: Tympanic membrane, ear canal and external ear normal. No drainage or tenderness.  No middle ear effusion. There is no impacted cerumen. Tympanic membrane is not erythematous.     Nose:  Congestion and rhinorrhea present.     Mouth/Throat:     Mouth: Mucous membranes are moist. No oral lesions.     Pharynx: No pharyngeal swelling, oropharyngeal exudate, posterior oropharyngeal erythema or uvula swelling.     Tonsils: No tonsillar exudate or tonsillar abscesses.  Eyes:     General: No scleral icterus.       Right eye: No discharge.        Left eye: No discharge.     Extraocular Movements: Extraocular movements intact.     Right eye: Normal extraocular motion.     Left eye: Normal extraocular motion.     Conjunctiva/sclera: Conjunctivae normal.  Cardiovascular:     Rate and Rhythm: Normal rate and regular rhythm.     Heart sounds: Normal heart sounds. No murmur heard.    No friction rub. No gallop.  Pulmonary:     Effort: Pulmonary effort is normal. No respiratory distress.     Breath sounds: No stridor. No wheezing, rhonchi or rales.  Chest:     Chest wall: No tenderness.  Musculoskeletal:     Cervical back: Normal range of motion and neck supple.  Lymphadenopathy:     Cervical: No cervical adenopathy.  Skin:    General: Skin is warm and dry.  Neurological:     General: No focal  deficit present.     Mental Status: She is alert and oriented to person, place, and time.  Psychiatric:        Mood and Affect: Mood normal.        Behavior: Behavior normal.     Assessment and Plan :   PDMP not reviewed this encounter.  1. Acute viral syndrome     Deferred imaging given clear cardiopulmonary exam, hemodynamically stable vital signs. Will manage for viral illness such as viral URI, viral syndrome, viral rhinitis, COVID-19. Recommended supportive care. Offered scripts for symptomatic relief. Testing is pending. Counseled patient on potential for adverse effects with medications prescribed/recommended today, ER and return-to-clinic precautions discussed, patient verbalized understanding.     Jaynee Eagles, [place] 10/07/21 1701

## 2021-10-07 NOTE — Discharge Instructions (Signed)

## 2021-10-08 LAB — SARS CORONAVIRUS 2 (TAT 6-24 HRS): SARS Coronavirus 2: NEGATIVE

## 2022-04-10 IMAGING — MG MM DIGITAL DIAGNOSTIC UNILAT*L* W/ TOMO W/ CAD
6 series · 6 of 14 positions shown · non-contrast
Comparison: Previous exam(s).

CLINICAL DATA: Short-term follow-up for probably benign left breast
calcifications, initially assessed on 04/02/2020.

EXAM:
DIGITAL DIAGNOSTIC UNILATERAL LEFT MAMMOGRAM WITH TOMOSYNTHESIS AND
CAD
TECHNIQUE: Left digital diagnostic mammography and breast tomosynthesis was
performed. The images were evaluated with computer-aided detection.

[L ML]
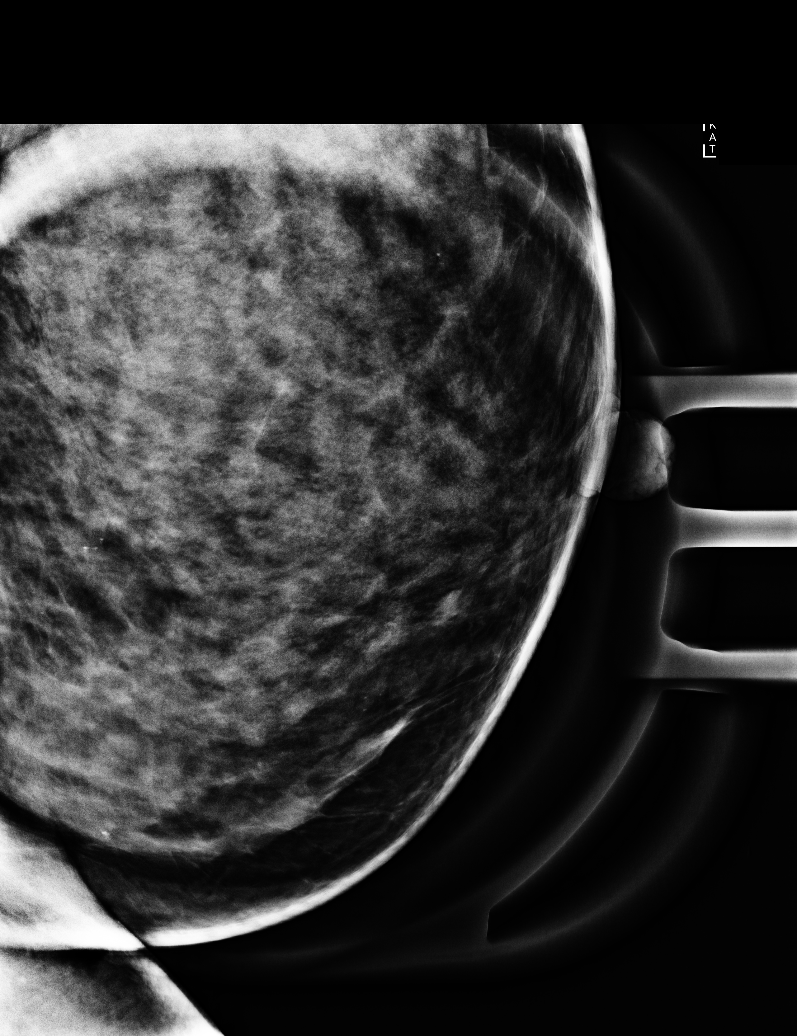

[L CC]
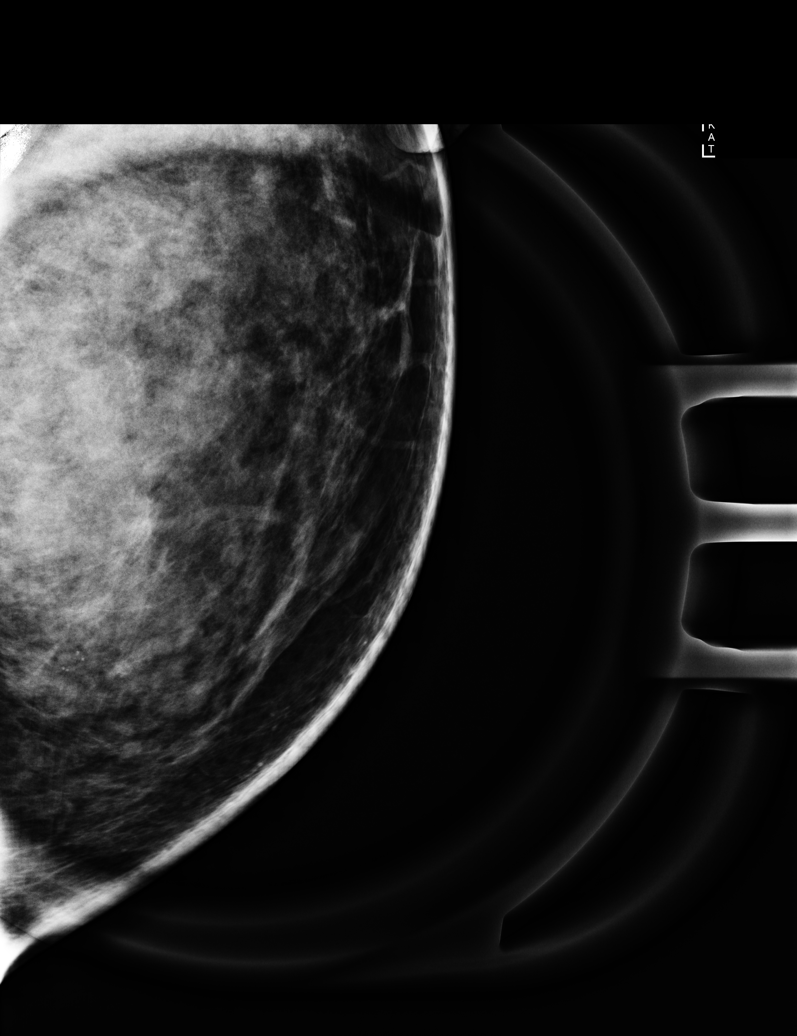

[L MLO synth-2D]
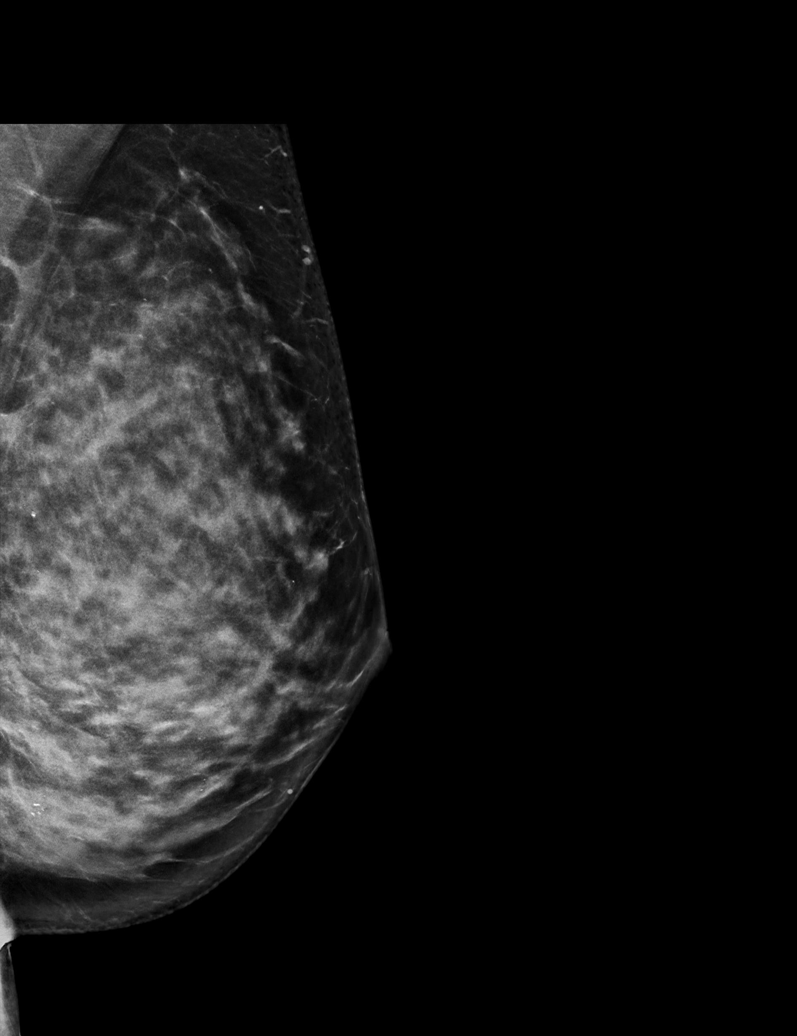

[L CC synth-2D]
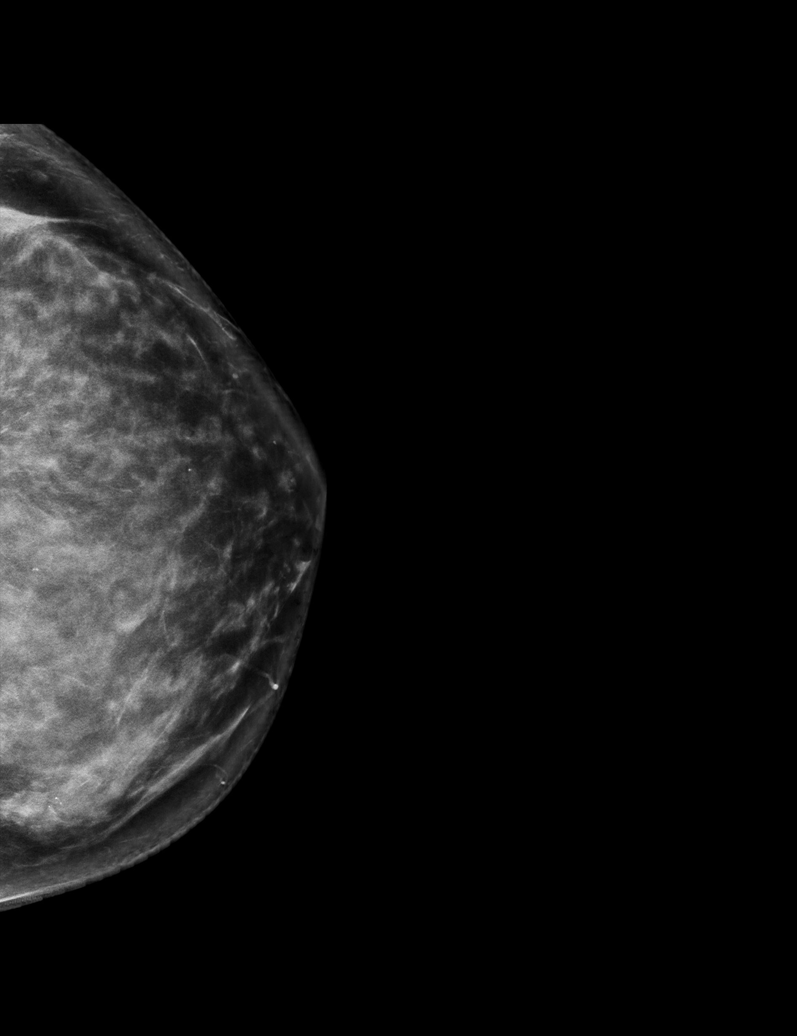

[L MLO tomo · tomo slice 37/74.0]
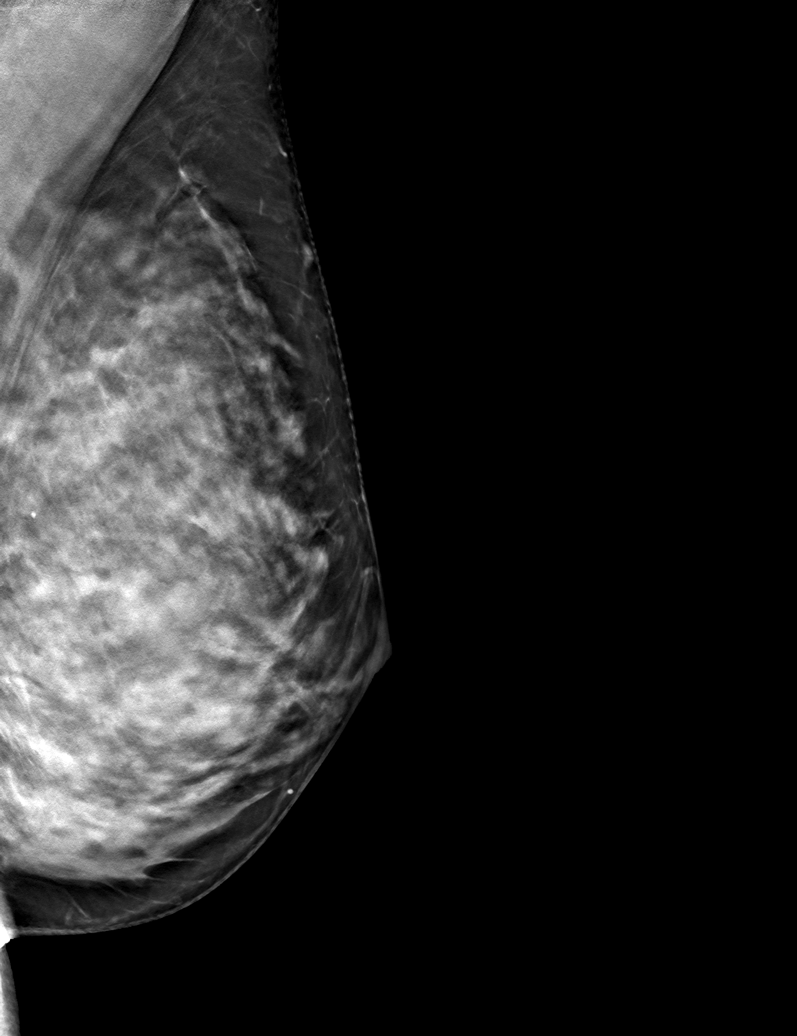

[L CC tomo · tomo slice 38/75.0]
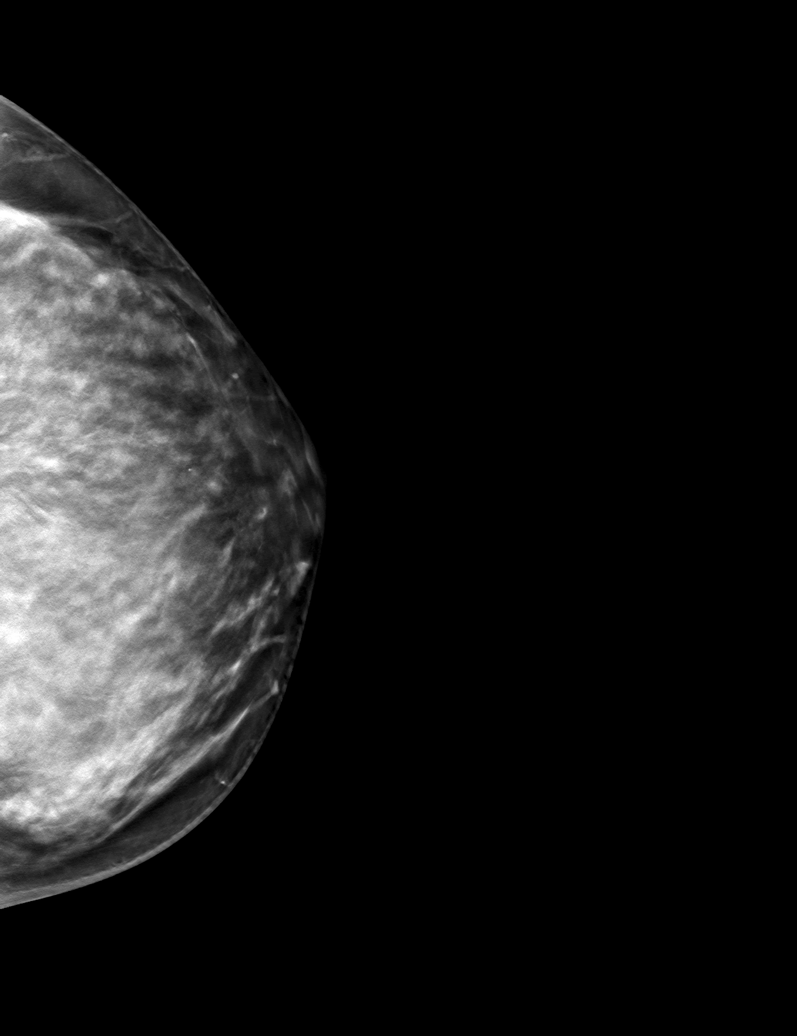

[6 of 14 positions shown; findings below may reference images not displayed]

ACR Breast Density Category c: The breast tissue is heterogeneously
dense, which may obscure small masses.
FINDINGS: The small group of calcifications in the lower inner left breast are
stable.

There are no masses or areas of architectural distortion. There are
no new calcifications.
IMPRESSION: 1. Probably benign left breast calcifications, stable for 6 months.
Additional short-term follow-up recommended.

RECOMMENDATION:
Diagnostic mammography with left breast magnification views in 6
months.

I have discussed the findings and recommendations with the patient.
If applicable, a reminder letter will be sent to the patient
regarding the next appointment.

BI-RADS CATEGORY  3: Probably benign.

## 2022-04-13 ENCOUNTER — Other Ambulatory Visit: Payer: Self-pay | Admitting: Obstetrics and Gynecology

## 2022-04-13 DIAGNOSIS — R928 Other abnormal and inconclusive findings on diagnostic imaging of breast: Secondary | ICD-10-CM

## 2022-05-03 ENCOUNTER — Other Ambulatory Visit: Payer: Commercial Managed Care - HMO

## 2022-06-08 ENCOUNTER — Ambulatory Visit
Admission: RE | Admit: 2022-06-08 | Discharge: 2022-06-08 | Disposition: A | Discharge status: Home / Self Care | Payer: Commercial Managed Care - HMO | Source: Ambulatory Visit | Attending: Obstetrics and Gynecology | Admitting: Obstetrics and Gynecology

## 2022-06-08 DIAGNOSIS — R928 Other abnormal and inconclusive findings on diagnostic imaging of breast: Secondary | ICD-10-CM
